# Patient Record
Sex: Female | Born: 1995 | Hispanic: Yes | Marital: Single | Smoking: Never smoker
Health system: Southern US, Community
[De-identification: ages and names within clinical notes are randomized; demographics above are authoritative.]

## PROBLEM LIST (undated history)

## (undated) DIAGNOSIS — R002 Palpitations: Secondary | ICD-10-CM

## (undated) DIAGNOSIS — R0600 Dyspnea, unspecified: Secondary | ICD-10-CM

## (undated) DIAGNOSIS — D352 Benign neoplasm of pituitary gland: Secondary | ICD-10-CM

## (undated) HISTORY — DX: Dyspnea, unspecified: R06.00

## (undated) HISTORY — DX: Palpitations: R00.2

## (undated) HISTORY — DX: Morbid (severe) obesity due to excess calories: E66.01

---

## 2016-01-06 ENCOUNTER — Emergency Department: Payer: BLUE CROSS/BLUE SHIELD

## 2016-01-06 ENCOUNTER — Emergency Department
Admission: EM | Admit: 2016-01-06 | Discharge: 2016-01-06 | Disposition: A | Discharge status: Home / Self Care | Payer: BLUE CROSS/BLUE SHIELD | Attending: Emergency Medicine | Admitting: Emergency Medicine

## 2016-01-06 DIAGNOSIS — K219 Gastro-esophageal reflux disease without esophagitis: Secondary | ICD-10-CM

## 2016-01-06 DIAGNOSIS — R0789 Other chest pain: Secondary | ICD-10-CM | POA: Diagnosis present

## 2016-01-06 DIAGNOSIS — R079 Chest pain, unspecified: Secondary | ICD-10-CM

## 2016-01-06 LAB — BASIC METABOLIC PANEL
ANION GAP: 8 (ref 5–15)
BUN: 18 mg/dL (ref 6–20)
CALCIUM: 9.4 mg/dL (ref 8.9–10.3)
CO2: 23 mmol/L (ref 22–32)
Chloride: 106 mmol/L (ref 101–111)
Creatinine, Ser: 0.53 mg/dL (ref 0.44–1.00)
GLUCOSE: 114 mg/dL — AB (ref 65–99)
Potassium: 3.7 mmol/L (ref 3.5–5.1)
Sodium: 137 mmol/L (ref 135–145)

## 2016-01-06 LAB — CBC
HCT: 41.6 % (ref 35.0–47.0)
HEMOGLOBIN: 13.9 g/dL (ref 12.0–16.0)
MCH: 29.5 pg (ref 26.0–34.0)
MCHC: 33.4 g/dL (ref 32.0–36.0)
MCV: 88.3 fL (ref 80.0–100.0)
Platelets: 333 10*3/uL (ref 150–440)
RBC: 4.71 MIL/uL (ref 3.80–5.20)
RDW: 13 % (ref 11.5–14.5)
WBC: 11.6 10*3/uL — ABNORMAL HIGH (ref 3.6–11.0)

## 2016-01-06 LAB — TROPONIN I

## 2016-01-06 MED ORDER — PANTOPRAZOLE SODIUM 20 MG PO TBEC: 20.0000 mg | DELAYED_RELEASE_TABLET | Freq: Every day | ORAL | 1 refills | Status: DC

## 2016-01-06 MED ORDER — GI COCKTAIL ~~LOC~~
30.0000 mL | Freq: Once | ORAL | Status: AC
  Administered 2016-01-06: 30 mL via ORAL
  Filled 2016-01-06: qty 30

## 2016-01-06 NOTE — ED Notes (Signed)
Patient reports chest pain and pressure that started yesterday. Last night it went away but when she woke up this morning the pain was there again. Patient states that the pressure radiates around both sides of her chest and into her back. Patient reports nausea with no vomiting or diarrhea. Patient diaphoresis or dizziness. Pressure is worse after eating.

## 2016-01-06 NOTE — ED Triage Notes (Signed)
Pt arrives to ER via POV c/o chest tightness and SOB that occurred yesterday and again today. Denies NV, diaphoresis. Pt alert and oriented X4, active, cooperative, pt in NAD. RR even and unlabored, color WNL.

## 2016-01-06 NOTE — ED Provider Notes (Signed)
[facility] Emergency Department Provider Note  Time seen: 2:20 PM  I have reviewed the triage vital signs and the nursing notes.   HISTORY  Chief Complaint Chest Pain    HPI Dana Ibarra is a 20 y.o. female with no past medical history who presents to the emergency department for chest discomfort. According to the patient for the past 2 days she has woken up with a vague pressure sensation to the middle of her chest that radiates to her back. States it improved somewhat throughout the day but then worsens upon awakening. Patient states this is happened to her several times in the past, comes and goes but this is the first time it has happened 2 days in a row. Patient denies any trouble breathing. Denies any nausea or diaphoresis. Denies any history of reflux in the past.  History reviewed. No pertinent past medical history.  There are no active problems to display for this patient.   History reviewed. No pertinent surgical history.  Prior to Admission medications   Not on File    No Known Allergies  No family history on file.  Social History Social History  Substance Use Topics  . Smoking status: Never Smoker  . Smokeless tobacco: Not on file  . Alcohol use No    Review of Systems Constitutional: Negative for fever. Cardiovascular: Central chest discomfort Respiratory: Negative for shortness of breath. Gastrointestinal: Negative for abdominal pain Musculoskeletal: Some radiation of pain to the back. Neurological: Negative for headache 10-point ROS otherwise negative.  ____________________________________________   PHYSICAL EXAM:  VITAL SIGNS: ED Triage Vitals  Enc Vitals Group     BP 01/06/16 1234 (!) 128/91     Pulse Rate 01/06/16 1234 90     Resp 01/06/16 1234 18     Temp 01/06/16 1234 98.5 F (36.9 C)     Temp Source 01/06/16 1234 Oral     SpO2 01/06/16 1234 97 %     Weight 01/06/16 1230 145 lb (65.8 kg)     Height 01/06/16  1230 5\' 2"  (1.575 m)     Head Circumference --      Peak Flow --      Pain Score 01/06/16 1231 8     Pain Loc --      Pain Edu? --      Excl. in [place]? --     Constitutional: Alert and oriented. Well appearing and in no distress. Eyes: Normal exam ENT   Head: Normocephalic and atraumatic   Mouth/Throat: Mucous membranes are moist. Cardiovascular: Normal rate, regular rhythm. No murmur Respiratory: Normal respiratory effort without tachypnea nor retractions. Breath sounds are clear  Gastrointestinal: Soft and nontender. No distention.  Musculoskeletal: Nontender with normal range of motion in all extremities.  Neurologic:  Normal speech and language. No gross focal neurologic deficits Skin:  Skin is warm, dry and intact.  Psychiatric: Mood and affect are normal.   ____________________________________________    EKG  EKG reviewed and interpreted by myself shows normal sinus rhythm at 79 bpm, narrow QRS, normal axis, normal intervals, no ST changes. Overall normal EKG.  ____________________________________________    RADIOLOGY  Chest x-ray is negative  ____________________________________________   INITIAL IMPRESSION / ASSESSMENT AND PLAN / ED COURSE  Pertinent labs & imaging results that were available during my care of the patient were reviewed by me and considered in my medical decision making (see chart for details).  The patient presents the emergency department with intermittent chest discomfort worse  over the past 2 mornings upon awakening, that improves throughout the day. Patient's symptoms are somewhat suggestive of gastric reflux. Patient's labs are within normal limits. Troponin is negative. EKG is normal. Chest x-rays negative. We will treat with a GI cocktail. Patient agreeable plan. She states very minimal chest discomfort currently.  Patient states initially after GI cocktail she feels the symptoms worsened, but now have improved. With normal labs normal  heart enzymes normal x-ray and EKG I believe the patient is safe for discharge home with PCP follow-up. We'll discharge with Protonix. Patient agreeable to plan.  ____________________________________________   FINAL CLINICAL IMPRESSION(S) / ED DIAGNOSES  Chest pain    Harvest Dark, MD 01/06/16 1515

## 2016-01-06 NOTE — Discharge Instructions (Signed)
You have been seen in the emergency department today for chest pain. Your workup has shown normal results. As we discussed please follow-up with your primary care physician in the next 1-2 days for recheck. Return to the emergency department for any further chest pain, trouble breathing, or any other symptom personally concerning to yourself. °

## 2016-07-02 ENCOUNTER — Other Ambulatory Visit: Payer: Self-pay | Admitting: Family Medicine

## 2016-07-02 DIAGNOSIS — N643 Galactorrhea not associated with childbirth: Secondary | ICD-10-CM

## 2016-07-05 ENCOUNTER — Ambulatory Visit
Admission: RE | Admit: 2016-07-05 | Discharge: 2016-07-05 | Disposition: A | Discharge status: Home / Self Care | Payer: BLUE CROSS/BLUE SHIELD | Source: Ambulatory Visit | Attending: Family Medicine | Admitting: Family Medicine

## 2016-07-05 DIAGNOSIS — N643 Galactorrhea not associated with childbirth: Secondary | ICD-10-CM

## 2016-07-05 DIAGNOSIS — O926 Galactorrhea: Secondary | ICD-10-CM | POA: Diagnosis present

## 2016-07-05 MED ORDER — GADOBENATE DIMEGLUMINE 529 MG/ML IV SOLN
10.0000 mL | Freq: Once | INTRAVENOUS | Status: AC | PRN
  Administered 2016-07-05: 6 mL via INTRAVENOUS

## 2016-12-08 ENCOUNTER — Encounter: Payer: Self-pay | Admitting: Emergency Medicine

## 2016-12-08 ENCOUNTER — Emergency Department
Admission: EM | Admit: 2016-12-08 | Discharge: 2016-12-08 | Disposition: A | Discharge status: Home / Self Care | Payer: BLUE CROSS/BLUE SHIELD | Attending: Emergency Medicine | Admitting: Emergency Medicine

## 2016-12-08 DIAGNOSIS — E876 Hypokalemia: Secondary | ICD-10-CM | POA: Diagnosis not present

## 2016-12-08 DIAGNOSIS — H81391 Other peripheral vertigo, right ear: Secondary | ICD-10-CM | POA: Diagnosis not present

## 2016-12-08 DIAGNOSIS — R42 Dizziness and giddiness: Secondary | ICD-10-CM | POA: Diagnosis present

## 2016-12-08 HISTORY — DX: Benign neoplasm of pituitary gland: D35.2

## 2016-12-08 LAB — BASIC METABOLIC PANEL
Anion gap: 7 (ref 5–15)
BUN: 13 mg/dL (ref 6–20)
CO2: 19 mmol/L — ABNORMAL LOW (ref 22–32)
CREATININE: 0.61 mg/dL (ref 0.44–1.00)
Calcium: 8 mg/dL — ABNORMAL LOW (ref 8.9–10.3)
Chloride: 113 mmol/L — ABNORMAL HIGH (ref 101–111)
GFR calc Af Amer: >60 mL/min (ref >60)
GLUCOSE: 85 mg/dL (ref 65–99)
Potassium: 3 mmol/L — ABNORMAL LOW (ref 3.5–5.1)
SODIUM: 139 mmol/L (ref 135–145)

## 2016-12-08 LAB — URINALYSIS, COMPLETE (UACMP) WITH MICROSCOPIC
BILIRUBIN URINE: NEGATIVE
GLUCOSE, UA: NEGATIVE mg/dL
KETONES UR: 5 mg/dL — AB
NITRITE: NEGATIVE
PH: 7 (ref 5.0–8.0)
Protein, ur: NEGATIVE mg/dL
SPECIFIC GRAVITY, URINE: 1.011 (ref 1.005–1.030)

## 2016-12-08 LAB — CBC
HCT: 36.3 % (ref 35.0–47.0)
Hemoglobin: 12.4 g/dL (ref 12.0–16.0)
MCH: 28.7 pg (ref 26.0–34.0)
MCHC: 34.1 g/dL (ref 32.0–36.0)
MCV: 84.1 fL (ref 80.0–100.0)
PLATELETS: 293 10*3/uL (ref 150–440)
RBC: 4.32 MIL/uL (ref 3.80–5.20)
RDW: 13.9 % (ref 11.5–14.5)
WBC: 11.4 10*3/uL — AB (ref 3.6–11.0)

## 2016-12-08 LAB — GLUCOSE, CAPILLARY: Glucose-Capillary: 87 mg/dL (ref 65–99)

## 2016-12-08 LAB — POCT PREGNANCY, URINE: PREG TEST UR: NEGATIVE

## 2016-12-08 MED ORDER — MECLIZINE HCL 25 MG PO TABS
50.0000 mg | ORAL_TABLET | Freq: Once | ORAL | Status: AC
  Administered 2016-12-08: 50 mg via ORAL
  Filled 2016-12-08: qty 2

## 2016-12-08 MED ORDER — POTASSIUM CHLORIDE CRYS ER 20 MEQ PO TBCR
40.0000 meq | EXTENDED_RELEASE_TABLET | Freq: Once | ORAL | Status: AC
  Administered 2016-12-08: 40 meq via ORAL
  Filled 2016-12-08: qty 2

## 2016-12-08 MED ORDER — MECLIZINE HCL 25 MG PO TABS: 25.0000 mg | ORAL_TABLET | Freq: Three times a day (TID) | ORAL | 1 refills | Status: DC | PRN

## 2016-12-08 MED ORDER — MECLIZINE HCL 25 MG PO TABS
ORAL_TABLET | ORAL | Status: AC
  Filled 2016-12-08: qty 1

## 2016-12-08 MED ORDER — ONDANSETRON 4 MG PO TBDP: 4.0000 mg | ORAL_TABLET | Freq: Three times a day (TID) | ORAL | 0 refills | Status: DC | PRN

## 2016-12-08 NOTE — ED Triage Notes (Signed)
Pt arrives ambulatory to triage with family for c/o dizziness. Pt has hx of pituitary tumor that they are watching. Pt states that she started feeling dizzy around 1700 this evening but denies LOC or trauma of any kind. Pt is in NAD at this time in triage.

## 2016-12-08 NOTE — ED Provider Notes (Signed)
[facility] Emergency Department Provider Note  ____________________________________________  Time seen: Approximately 10:16 PM  I have reviewed the triage vital signs and the nursing notes.   HISTORY  Chief Complaint Dizziness    HPI Dana Ibarra is a 21 y.o. female who complains of dizziness described as room spinning that is been intermittent since 5 PM today. Worse with turning her head to the right or lying down quickly. No vision changes, headache, paresthesias or motor weakness. No trauma. Denies fever chills no neck pain or stiffness. No other acute complaints, no dysuria frequency urgency.  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 She's been on medication for this, and recent testing shows her prolactin level markedly decreased.     Past Medical History:  Diagnosis Date  . Benign tumor of pituitary gland ([place])      There are no active problems to display for this patient.    History reviewed. No pertinent surgical history.   Prior to Admission medications   Medication Sig Start Date End Date Taking? Authorizing Provider  cabergoline (DOSTINEX) 0.5 MG tablet Take 0.5 mg by mouth once a week. 12/05/16  Yes [provider]  meclizine (ANTIVERT) 25 MG tablet Take 1 tablet (25 mg total) by mouth 3 (three) times daily as needed for dizziness or nausea. 12/08/16   Carrie Mew, MD  ondansetron (ZOFRAN ODT) 4 MG disintegrating tablet Take 1 tablet (4 mg total) by mouth every 8 (eight) hours as needed for nausea or vomiting. 12/08/16   Carrie Mew, MD  pantoprazole (PROTONIX) 20 MG tablet Take 1 tablet (20 mg total) by mouth daily. Patient not taking: Reported on 12/08/2016 01/06/16 01/05/17  Harvest Dark, MD     Allergies Patient has no known allergies.   No family history on file.  Social History Social History  Substance Use Topics  . Smoking status: Never Smoker  .  Smokeless tobacco: Never Used  . Alcohol use No    Review of Systems  Constitutional:   No fever or chills.  ENT:   No sore throat. No rhinorrhea. Cardiovascular:   No chest pain or syncope. Respiratory:   No dyspnea or cough. Gastrointestinal:   Negative for abdominal pain, vomiting and diarrhea.  Musculoskeletal:   Negative for focal pain or swelling All other systems reviewed and are negative except as documented above in ROS and HPI.  ____________________________________________   PHYSICAL EXAM:  VITAL SIGNS: ED Triage Vitals  Enc Vitals Group     BP 12/08/16 1907 123/81     Pulse Rate 12/08/16 1907 85     Resp 12/08/16 1907 18     Temp 12/08/16 1907 98 F (36.7 C)     Temp Source 12/08/16 1907 Oral     SpO2 12/08/16 1907 100 %     Weight 12/08/16 1910 160 lb (72.6 kg)     Height 12/08/16 1910 5\' 2"  (1.575 m)     Head Circumference --      Peak Flow --      Pain Score 12/08/16 1909 0     Pain Loc --      Pain Edu? --      Excl. in [place]? --     Vital signs reviewed, nursing assessments reviewed.   Constitutional:   Alert and oriented. Well appearing and in no distress. Eyes:   No scleral icterus.  EOMI. No nystagmus. No conjunctival pallor. PERRL. ENT   Head:  Normocephalic and atraumatic.   Nose:   No congestion/rhinnorhea.    Mouth/Throat:   MMM, no pharyngeal erythema. No peritonsillar mass.    Neck:   No meningismus. Full ROM Hematological/Lymphatic/Immunilogical:   No cervical lymphadenopathy. Cardiovascular:   RRR. Symmetric bilateral radial and DP pulses.  No murmurs.  Respiratory:   Normal respiratory effort without tachypnea/retractions. Breath sounds are clear and equal bilaterally. No wheezes/rales/rhonchi. Gastrointestinal:   Soft and nontender. Non distended. There is no CVA tenderness.  No rebound, rigidity, or guarding. Genitourinary:   deferred Musculoskeletal:   Normal range of motion in all extremities. No joint effusions.  No  lower extremity tenderness.  No edema. Neurologic:   Normal speech and language.  Motor grossly intact. no pronator drift. Normal gait. No gross focal neurologic deficits are appreciated.  Skin:    Skin is warm, dry and intact. No rash noted.  No petechiae, purpura, or bullae.  ____________________________________________    LABS (pertinent positives/negatives) (all labs ordered are listed, but only abnormal results are displayed) Labs Reviewed  BASIC METABOLIC PANEL - Abnormal; Notable for the following:       Result Value   Potassium 3.0 (*)    Chloride 113 (*)    CO2 19 (*)    Calcium 8.0 (*)    All other components within normal limits  CBC - Abnormal; Notable for the following:    WBC 11.4 (*)    All other components within normal limits  URINALYSIS, COMPLETE (UACMP) WITH MICROSCOPIC - Abnormal; Notable for the following:    Color, Urine STRAW (*)    APPearance CLEAR (*)    Hgb urine dipstick MODERATE (*)    Ketones, ur 5 (*)    Leukocytes, UA SMALL (*)    Bacteria, UA RARE (*)    Squamous Epithelial / LPF 0-5 (*)    All other components within normal limits  GLUCOSE, CAPILLARY  CBG MONITORING, ED  POC URINE PREG, ED  POCT PREGNANCY, URINE   ____________________________________________   EKG  interpreted by me Normal sinus rhythm rate of 83, rightward axis, normal ST segments and T waves. Normal QRS.  ____________________________________________    RADIOLOGY  No results found.  ____________________________________________   PROCEDURES Procedures  ____________________________________________   INITIAL IMPRESSION / ASSESSMENT AND PLAN / ED COURSE  Pertinent labs & imaging results that were available during my care of the patient were reviewed by me and considered in my medical decision making (see chart for details).  patient well appearing no acute distress presents with symptoms consistent with peripheral vertigo. Neuro exam is benign, vital  signs normal, labs unremarkable. I don't think repeat neuro imaging is warranted at this time, low suspicion for intracranial mass effect. Dizziness and vertigo should not be a consequence of a pituitary mass. It is however a known side effect of the medication she is taken cabergoline. Recent follow-up testing of her prolactin level shows good response to therapy.continue meclizine, Zofran as needed, follow up with primary care.      ____________________________________________   FINAL CLINICAL IMPRESSION(S) / ED DIAGNOSES  Final diagnoses:  Peripheral vertigo involving right ear  Hypokalemia      New Prescriptions   MECLIZINE (ANTIVERT) 25 MG TABLET    Take 1 tablet (25 mg total) by mouth 3 (three) times daily as needed for dizziness or nausea.   ONDANSETRON (ZOFRAN ODT) 4 MG DISINTEGRATING TABLET    Take 1 tablet (4 mg total) by mouth every 8 (eight) hours as needed for  nausea or vomiting.     Portions of this note were generated with dragon dictation software. Dictation errors may occur despite best attempts at proofreading.    Carrie Mew, MD 12/08/16 2221

## 2016-12-08 NOTE — ED Notes (Signed)
Patient urged to void, given ginger ale

## 2020-07-03 ENCOUNTER — Encounter: Payer: Self-pay | Admitting: Radiology

## 2020-07-03 ENCOUNTER — Emergency Department
Admission: EM | Admit: 2020-07-03 | Discharge: 2020-07-03 | Disposition: A | Discharge status: Home / Self Care | Payer: BC Managed Care – PPO | Attending: Emergency Medicine | Admitting: Emergency Medicine

## 2020-07-03 ENCOUNTER — Emergency Department: Payer: BC Managed Care – PPO

## 2020-07-03 ENCOUNTER — Other Ambulatory Visit: Payer: Self-pay

## 2020-07-03 DIAGNOSIS — R1110 Vomiting, unspecified: Secondary | ICD-10-CM | POA: Diagnosis present

## 2020-07-03 DIAGNOSIS — K529 Noninfective gastroenteritis and colitis, unspecified: Secondary | ICD-10-CM | POA: Diagnosis not present

## 2020-07-03 DIAGNOSIS — Z86018 Personal history of other benign neoplasm: Secondary | ICD-10-CM | POA: Insufficient documentation

## 2020-07-03 DIAGNOSIS — Z20822 Contact with and (suspected) exposure to covid-19: Secondary | ICD-10-CM | POA: Diagnosis not present

## 2020-07-03 DIAGNOSIS — R072 Precordial pain: Secondary | ICD-10-CM | POA: Insufficient documentation

## 2020-07-03 DIAGNOSIS — R079 Chest pain, unspecified: Secondary | ICD-10-CM

## 2020-07-03 LAB — URINALYSIS, COMPLETE (UACMP) WITH MICROSCOPIC
Bilirubin Urine: NEGATIVE
Glucose, UA: NEGATIVE mg/dL
Ketones, ur: 20 mg/dL — AB
Leukocytes,Ua: NEGATIVE
Nitrite: NEGATIVE
Protein, ur: NEGATIVE mg/dL
RBC / HPF: >50 RBC/hpf — ABNORMAL HIGH (ref 0–5)
Specific Gravity, Urine: 1.018 (ref 1.005–1.030)
pH: 6 (ref 5.0–8.0)

## 2020-07-03 LAB — RESP PANEL BY RT-PCR (FLU A&B, COVID) ARPGX2
Influenza A by PCR: NEGATIVE
Influenza B by PCR: NEGATIVE
SARS Coronavirus 2 by RT PCR: NEGATIVE

## 2020-07-03 LAB — BASIC METABOLIC PANEL
Anion gap: 8 (ref 5–15)
BUN: 15 mg/dL (ref 6–20)
CO2: 22 mmol/L (ref 22–32)
Calcium: 8.8 mg/dL — ABNORMAL LOW (ref 8.9–10.3)
Chloride: 109 mmol/L (ref 98–111)
Creatinine, Ser: 0.62 mg/dL (ref 0.44–1.00)
GFR, Estimated: >60 mL/min (ref >60)
Glucose, Bld: 120 mg/dL — ABNORMAL HIGH (ref 70–99)
Potassium: 3.8 mmol/L (ref 3.5–5.1)
Sodium: 139 mmol/L (ref 135–145)

## 2020-07-03 LAB — TROPONIN I (HIGH SENSITIVITY)
Troponin I (High Sensitivity): <2 ng/L (ref <18)
Troponin I (High Sensitivity): <2 ng/L (ref <18)

## 2020-07-03 LAB — D-DIMER, QUANTITATIVE: D-Dimer, Quant: 0.35 ug/mL-FEU (ref 0.00–0.50)

## 2020-07-03 LAB — CBC
HCT: 41.2 % (ref 36.0–46.0)
Hemoglobin: 13.3 g/dL (ref 12.0–15.0)
MCH: 27.5 pg (ref 26.0–34.0)
MCHC: 32.3 g/dL (ref 30.0–36.0)
MCV: 85.3 fL (ref 80.0–100.0)
Platelets: 377 10*3/uL (ref 150–400)
RBC: 4.83 MIL/uL (ref 3.87–5.11)
RDW: 13.2 % (ref 11.5–15.5)
WBC: 18.4 10*3/uL — ABNORMAL HIGH (ref 4.0–10.5)
nRBC: 0 % (ref 0.0–0.2)

## 2020-07-03 LAB — POC URINE PREG, ED: Preg Test, Ur: NEGATIVE

## 2020-07-03 MED ORDER — IOHEXOL 350 MG/ML SOLN
75.0000 mL | Freq: Once | INTRAVENOUS | Status: AC | PRN
  Administered 2020-07-03: 75 mL via INTRAVENOUS

## 2020-07-03 MED ORDER — ONDANSETRON 4 MG PO TBDP: 4.0000 mg | ORAL_TABLET | Freq: Once | ORAL | Status: AC

## 2020-07-03 MED ORDER — ONDANSETRON 4 MG PO TBDP: 4.0000 mg | ORAL_TABLET | Freq: Three times a day (TID) | ORAL | 0 refills | Status: DC | PRN

## 2020-07-03 MED ORDER — SODIUM CHLORIDE 0.9 % IV BOLUS
1000.0000 mL | Freq: Once | INTRAVENOUS | Status: AC
  Administered 2020-07-03: 1000 mL via INTRAVENOUS

## 2020-07-03 MED ORDER — ONDANSETRON 4 MG PO TBDP
ORAL_TABLET | ORAL | Status: AC
  Administered 2020-07-03: 4 mg via ORAL
  Filled 2020-07-03: qty 1

## 2020-07-03 MED ORDER — SODIUM CHLORIDE 0.9 % IV BOLUS
1000.0000 mL | Freq: Once | INTRAVENOUS | Status: AC
Start: 2020-07-03 — End: 2020-07-03
  Administered 2020-07-03: 1000 mL via INTRAVENOUS

## 2020-07-03 MED ORDER — LIDOCAINE VISCOUS HCL 2 % MT SOLN
15.0000 mL | Freq: Once | OROMUCOSAL | Status: AC
  Administered 2020-07-03: 15 mL via ORAL
  Filled 2020-07-03: qty 15

## 2020-07-03 MED ORDER — ALUM & MAG HYDROXIDE-SIMETH 200-200-20 MG/5ML PO SUSP
30.0000 mL | Freq: Once | ORAL | Status: AC
  Administered 2020-07-03: 30 mL via ORAL
  Filled 2020-07-03: qty 30

## 2020-07-03 MED ORDER — ONDANSETRON HCL 4 MG/2ML IJ SOLN
4.0000 mg | Freq: Once | INTRAMUSCULAR | Status: DC
  Filled 2020-07-03: qty 2

## 2020-07-03 NOTE — Discharge Instructions (Addendum)
Follow-up with your regular doctor if not improving to 3 days.  Return emergency department worsening.  Take medications as prescribed. 

## 2020-07-03 NOTE — ED Triage Notes (Signed)
Pt via POV from home. Pt c/o centralized CP that radiates to her back that started this AM. Pt describes it as aching and pressure. Also c/o SOB that accompanied the CP. Pt states she has been having diarrhea and vomiting. Denies cough and fevers. Pt A&Ox4 and NAD.

## 2020-07-03 NOTE — ED Provider Notes (Signed)
[facility] Emergency Department Provider Note  ____________________________________________   Event Date/Time   First MD Initiated Contact with Patient 07/03/20 1309     (approximate)  I have reviewed the triage vital signs and the nursing notes.   HISTORY  Chief Complaint Chest Pain    HPI Dana Ibarra is a 25 y.o. female presents emergency department complaining of vomiting and diarrhea that started early this morning/last night.  Patient states she has had 4 episodes of vomiting and 5 episodes of diarrhea.  States she had some midsternal chest pain that caused her to have difficulty breathing.  She denies any fever or chills.  No UTI symptoms.  Patient has a IUD for birth control.  She is a non-smoker.    Past Medical History:  Diagnosis Date  . Benign tumor of pituitary gland ([place])     There are no problems to display for this patient.   No past surgical history on file.  Prior to Admission medications   Medication Sig Start Date End Date Taking? Authorizing Provider  ondansetron (ZOFRAN-ODT) 4 MG disintegrating tablet Take 1 tablet (4 mg total) by mouth every 8 (eight) hours as needed. 07/03/20  Yes Gwenith Tschida, Linden Dolin, PA-C  cabergoline (DOSTINEX) 0.5 MG tablet Take 0.5 mg by mouth once a week. 12/05/16   [provider]  meclizine (ANTIVERT) 25 MG tablet Take 1 tablet (25 mg total) by mouth 3 (three) times daily as needed for dizziness or nausea. 12/08/16   Carrie Mew, MD  pantoprazole (PROTONIX) 20 MG tablet Take 1 tablet (20 mg total) by mouth daily. Patient not taking: Reported on 12/08/2016 01/06/16 01/05/17  Harvest Dark, MD    Allergies Patient has no known allergies.  No family history on file.  Social History Social History   Tobacco Use  . Smoking status: Never Smoker  . Smokeless tobacco: Never Used  Substance Use Topics  . Alcohol use: No  . Drug use: No    Review of Systems  Constitutional: No  fever/chills Eyes: No visual changes. ENT: No sore throat. Respiratory: Denies cough Cardiovascular: Positive chest pain Gastrointestinal: Denies abdominal pain, positive vomiting diarrhea Genitourinary: Negative for dysuria. Musculoskeletal: Negative for back pain. Skin: Negative for rash. Psychiatric: no mood changes,     ____________________________________________   PHYSICAL EXAM:  VITAL SIGNS: ED Triage Vitals  Enc Vitals Group     BP 07/03/20 1136 115/80     Pulse Rate 07/03/20 1136 (!) 113     Resp 07/03/20 1136 20     Temp 07/03/20 1136 98.5 F (36.9 C)     Temp Source 07/03/20 1136 Oral     SpO2 07/03/20 1136 98 %     Weight 07/03/20 1137 160 lb (72.6 kg)     Height 07/03/20 1137 5\' 2"  (1.575 m)     Head Circumference --      Peak Flow --      Pain Score 07/03/20 1137 8     Pain Loc --      Pain Edu? --      Excl. in [place]? --     Constitutional: Alert and oriented. Well appearing and in no acute distress. Eyes: Conjunctivae are normal.  Head: Atraumatic. Nose: No congestion/rhinnorhea. Mouth/Throat: Mucous membranes are moist.   Neck:  supple no lymphadenopathy noted Cardiovascular: Normal rate, regular rhythm. Heart sounds are normal Respiratory: Normal respiratory effort.  No retractions, lungs c t a  Abd: soft nontender bs normal all 4 quad  GU: deferred Musculoskeletal: FROM all extremities, warm and well perfused Neurologic:  Normal speech and language.  Skin:  Skin is warm, dry and intact. No rash noted. Psychiatric: Mood and affect are normal. Speech and behavior are normal.  ____________________________________________   LABS (all labs ordered are listed, but only abnormal results are displayed)  Labs Reviewed  BASIC METABOLIC PANEL - Abnormal; Notable for the following components:      Result Value   Glucose, Bld 120 (*)    Calcium 8.8 (*)    All other components within normal limits  CBC - Abnormal; Notable for the following components:    WBC 18.4 (*)    All other components within normal limits  URINALYSIS, COMPLETE (UACMP) WITH MICROSCOPIC - Abnormal; Notable for the following components:   Color, Urine YELLOW (*)    APPearance HAZY (*)    Hgb urine dipstick LARGE (*)    Ketones, ur 20 (*)    RBC / HPF >50 (*)    Bacteria, UA RARE (*)    All other components within normal limits  RESP PANEL BY RT-PCR (FLU A&B, COVID) ARPGX2  D-DIMER, QUANTITATIVE  POC URINE PREG, ED  TROPONIN I (HIGH SENSITIVITY)  TROPONIN I (HIGH SENSITIVITY)   ____________________________________________   ____________________________________________  RADIOLOGY  Chest x-ray CTA for PE  ____________________________________________   PROCEDURES  Procedure(s) performed: EKG shows tachycardia, see physician read  Procedures    ____________________________________________   INITIAL IMPRESSION / ASSESSMENT AND PLAN / ED COURSE  Pertinent labs & imaging results that were available during my care of the patient were reviewed by me and considered in my medical decision making (see chart for details).   Patient is 25 year old female presents with vomiting/diarrhea, chest pain and some shortness of breath.  See HPI.  Physical exam is actually unremarkable other than the tachycardia.   DDx: Gastroenteritis, influenza, Covid, CAP, PE  CBC has elevated WBC of 50.0, basic metabolic panel with glucose of 120, troponin is normal  Patient still tachycardic after 1 L normal saline.  Still complaining of chest pain that makes her feel short of breath.  CT for PE ordered  CTA for PE reviewed by me confirmed by radiology to be normal  Did explain everything to the patient.  She was given a prescription for Zofran ODT.  Follow-up with her regular doctor if not improving to 3 days.  Return if worsening.  She is discharged stable condition.   Dana Ibarra was evaluated in Emergency Department on 07/03/2020 for the symptoms described in the  history of present illness. She was evaluated in the context of the global COVID-19 pandemic, which necessitated consideration that the patient might be at risk for infection with the SARS-CoV-2 virus that causes COVID-19. Institutional protocols and algorithms that pertain to the evaluation of patients at risk for COVID-19 are in a state of rapid change based on information released by regulatory bodies including the CDC and federal and state organizations. These policies and algorithms were followed during the patient's care in the ED.    As part of my medical decision making, I reviewed the following data within the [facility] notes reviewed and incorporated, Labs reviewed , EKG interpreted tachycardia, Old chart reviewed, Radiograph reviewed , Notes from prior ED visits and [place] Controlled Substance Database  ____________________________________________   FINAL CLINICAL IMPRESSION(S) / ED DIAGNOSES  Final diagnoses:  Gastroenteritis  Nonspecific chest pain      NEW MEDICATIONS STARTED DURING THIS VISIT:  New Prescriptions   ONDANSETRON (ZOFRAN-ODT) 4 MG DISINTEGRATING TABLET    Take 1 tablet (4 mg total) by mouth every 8 (eight) hours as needed.     Note:  This document was prepared using Dragon voice recognition software and may include unintentional dictation errors.    Versie Starks, PA-C 07/03/20 Mirian Capuchin    Vladimir Crofts, MD 07/04/20 1120

## 2020-07-03 NOTE — ED Notes (Signed)
Pt actively vomiting during triage after straight stick. See orders.

## 2020-07-03 NOTE — ED Notes (Signed)
Signature pad not working, pt provided with AVS/discharge instructions, answered all questions, discharged to home with husband

## 2021-07-27 ENCOUNTER — Ambulatory Visit (INDEPENDENT_AMBULATORY_CARE_PROVIDER_SITE_OTHER): Payer: BC Managed Care – PPO

## 2021-07-27 ENCOUNTER — Encounter: Payer: Self-pay | Admitting: Cardiology

## 2021-07-27 ENCOUNTER — Ambulatory Visit: Payer: BC Managed Care – PPO | Admitting: Cardiology

## 2021-07-27 VITALS — BP 108/60 | HR 80 | Ht 63.0 in | Wt 172.0 lb

## 2021-07-27 DIAGNOSIS — R002 Palpitations: Secondary | ICD-10-CM | POA: Diagnosis not present

## 2021-07-27 DIAGNOSIS — R0602 Shortness of breath: Secondary | ICD-10-CM | POA: Diagnosis not present

## 2021-07-27 NOTE — Patient Instructions (Signed)
Medication Instructions:  ?Your physician recommends that you continue on your current medications as directed. Please refer to the Current Medication list given to you today. ? ?*If you need a refill on your cardiac medications before your next appointment, please call your pharmacy* ? ? ?Lab Work: ?None ordered ?If you have labs (blood work) drawn today and your tests are completely normal, you will receive your results only by: ?MyChart Message (if you have MyChart) OR ?A paper copy in the mail ?If you have any lab test that is abnormal or we need to change your treatment, we will call you to review the results. ? ? ?Testing/Procedures: ? ?Your physician has requested that you have an echocardiogram. Echocardiography is a painless test that uses sound waves to create images of your heart. It provides your doctor with information about the size and shape of your heart and how well your heart?s chambers and valves are working. This procedure takes approximately one hour. There are no restrictions for this procedure. ? ?2.   Your physician has recommended that you wear a Zio XT monitor for 2 weeks. This will be mailed to your home address in 4-5 business days.  ? ?This monitor is a medical device that records the heart?s electrical activity. Doctors most often use these monitors to diagnose arrhythmias. Arrhythmias are problems with the speed or rhythm of the heartbeat. The monitor is a small device applied to your chest. You can wear one while you do your normal daily activities. While wearing this monitor if you have any symptoms to push the button and record what you felt. Once you have worn this monitor for the period of time provider prescribed (Usually 14 days), you will return the monitor device in the postage paid box. Once it is returned they will download the data collected and provide [place] with a report which the provider will then review and we will call you with those results. Important tips: ? ?Avoid  showering during the first 24 hours of wearing the monitor. ?Avoid excessive sweating to help maximize wear time. ?Do not submerge the device, no hot tubs, and no swimming pools. ?Keep any lotions or oils away from the patch. ?After 24 hours you may shower with the patch on. Take brief showers with your back facing the shower head.  ?Do not remove patch once it has been placed because that will interrupt data and decrease adhesive wear time. ?Push the button when you have any symptoms and write down what you were feeling. ?Once you have completed wearing your monitor, remove and place into box which has postage paid and place in your outgoing mailbox.  ?If for some reason you have misplaced your box then call our office and we can provide another box and/or mail it off for you. ? ?  ? ? ? ?Follow-Up: ?At [facility], you and your health needs are our priority.  As part of our continuing mission to provide you with exceptional heart care, we have created designated Provider Care Teams.  These Care Teams include your primary Cardiologist (physician) and Advanced Practice Providers (APPs -  Physician Assistants and Nurse Practitioners) who all work together to provide you with the care you need, when you need it. ? ?We recommend signing up for the patient portal called "MyChart".  Sign up information is provided on this After Visit Summary.  MyChart is used to connect with patients for Virtual Visits (Telemedicine).  Patients are able to view lab/test results, encounter notes,  upcoming appointments, etc.  Non-urgent messages can be sent to your provider as well.   ?To learn more about what you can do with MyChart, go to [email or web address].   ? ?Your next appointment:   ?6 week(s) ? ?The format for your next appointment:   ?In Person ? ?Provider:   ?You may see Kate Sable, MD or one of the following Advanced Practice Providers on your designated Care Team:   ?Murray Hodgkins, NP ?Christell Faith,  PA-C ?Cadence Kathlen Mody, PA-C  ? ? ?Other Instructions ? ? ?Important Information About Sugar ? ? ? ? ? ? ?

## 2021-07-27 NOTE — Progress Notes (Signed)
?Cardiology Office Note:   ? ?Date:  07/27/2021  ? ?ID:  Dana Ibarra, DOB [birth year], MRN [id number] ? ?PCP:  Gennette Pac, FNP ?  ?[facility] Providers ?Cardiologist:  None    ? ?Referring MD: Gennette Pac, FNP  ? ?Chief Complaint  ?Patient presents with  ? New Patient (Initial Visit)  ?  Referred by PCP for  for Palpitations, Pituitary Microadenoma, Adenoid Hypertrophy. Meds reviewed verbally with patient.  ?  ? ?Dana Ibarra is a 26 y.o. female who is being seen today for the evaluation of palpitations at the request of Gennette Pac, [place].  ? ?History of Present Illness:   ? ?Dana Ibarra is a 26 y.o. female with a hx of pituitary adenoma who presents due to palpitations.  She states having worsening palpitations ongoing over the past year.  Symptoms usually last about 30 minutes, associated with shortness of breath.  Denies syncope.  Denies any history of heart disease, takes Victoza to help with weight loss. ? ?Past Medical History:  ?Diagnosis Date  ? 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 ? ?History reviewed. No pertinent surgical history. ? ?Current Medications: ?Current Meds  ?Medication Sig  ? VICTOZA 18 MG/3ML SOPN Inject into the skin.  ?  ? ?Allergies:   Patient has no known allergies.  ? ?Social History  ? ?Socioeconomic History  ? Marital status: Single  ?  Spouse name: Not on file  ? Number of children: Not on file  ? Years of education: Not on file  ? Highest education level: Not on file  ?Occupational History  ? Not on file  ?Tobacco Use  ? Smoking status: Never  ? Smokeless tobacco: Never  ?Substance and Sexual Activity  ? Alcohol use: No  ? Drug use: No  ? Sexual activity: Not on file  ?Other Topics Concern  ? Not on file  ?Social History Narrative  ? Not on file  ? ?Social Determinants of Health  ? ?Financial Resource Strain: Not on file  ?Food Insecurity: Not on file  ?Transportation Needs: Not on file  ?Physical Activity: Not on file  ?Stress: Not on file  ?Social Connections:  Not on file  ?  ? ?Family History: ?The patient's family history is not on file. ? ?ROS:   ?Please see the history of present illness.    ? All other systems reviewed and are negative. ? ?EKGs/Labs/Other Studies Reviewed:   ? ?The following studies were reviewed today: ? ? ?EKG:  EKG is  ordered today.  The ekg ordered today demonstrates normal sinus rhythm, normal ECG ? ?Recent Labs: ?No results found for requested labs within last 8760 hours.  ?Recent Lipid Panel ?No results found for: CHOL, TRIG, HDL, CHOLHDL, VLDL, LDLCALC, LDLDIRECT ? ? ?Risk Assessment/Calculations:   ? ? ?    ? ?Physical Exam:   ? ?VS:  BP 108/60 (BP Location: Left Arm, Patient Position: Sitting, Cuff Size: Normal)   Pulse 80   Ht '5\' 3"'$  (1.6 m)   Wt 172 lb (78 kg)   BMI 30.47 kg/m?    ? ?Wt Readings from Last 3 Encounters:  ?07/27/21 172 lb (78 kg)  ?07/03/20 160 lb (72.6 kg)  ?12/08/16 160 lb (72.6 kg)  ?  ? ?GEN:  Well nourished, well developed in no acute distress ?HEENT: Normal ?NECK: No JVD; No carotid bruits ?LYMPHATICS: No lymphadenopathy ?CARDIAC: RRR, no murmurs, rubs, gallops ?RESPIRATORY:  Clear to auscultation without rales, wheezing or rhonchi  ?ABDOMEN: Soft, non-tender,  non-distended ?MUSCULOSKELETAL:  No edema; No deformity  ?SKIN: Warm and dry ?NEUROLOGIC:  Alert and oriented x 3 ?PSYCHIATRIC:  Normal affect  ? ?ASSESSMENT:   ? ?1. Palpitations   ?2. Shortness of breath   ? ?PLAN:   ? ?In order of problems listed above: ? ?Palpitations, placed 2-week cardiac monitor to evaluate any significant arrhythmias. ?Shortness of breath, obtain echo to evaluate any structural abnormalities. ? ?Follow-up after echo and cardiac monitor. ? ?   ? ? ?Medication Adjustments/Labs and Tests Ordered: ?Current medicines are reviewed at length with the patient today.  Concerns regarding medicines are outlined above.  ?Orders Placed This Encounter  ?Procedures  ? LONG TERM MONITOR (3-14 DAYS)  ? EKG 12-Lead  ? ECHOCARDIOGRAM COMPLETE  ? ?No  orders of the defined types were placed in this encounter. ? ? ?Patient Instructions  ?Medication Instructions:  ?Your physician recommends that you continue on your current medications as directed. Please refer to the Current Medication list given to you today. ? ?*If you need a refill on your cardiac medications before your next appointment, please call your pharmacy* ? ? ?Lab Work: ?None ordered ?If you have labs (blood work) drawn today and your tests are completely normal, you will receive your results only by: ?MyChart Message (if you have MyChart) OR ?A paper copy in the mail ?If you have any lab test that is abnormal or we need to change your treatment, we will call you to review the results. ? ? ?Testing/Procedures: ? ?Your physician has requested that you have an echocardiogram. Echocardiography is a painless test that uses sound waves to create images of your heart. It provides your doctor with information about the size and shape of your heart and how well your heart?s chambers and valves are working. This procedure takes approximately one hour. There are no restrictions for this procedure. ? ?2.   Your physician has recommended that you wear a Zio XT monitor for 2 weeks. This will be mailed to your home address in 4-5 business days.  ? ?This monitor is a medical device that records the heart?s electrical activity. Doctors most often use these monitors to diagnose arrhythmias. Arrhythmias are problems with the speed or rhythm of the heartbeat. The monitor is a small device applied to your chest. You can wear one while you do your normal daily activities. While wearing this monitor if you have any symptoms to push the button and record what you felt. Once you have worn this monitor for the period of time provider prescribed (Usually 14 days), you will return the monitor device in the postage paid box. Once it is returned they will download the data collected and provide [place] with a report which the provider  will then review and we will call you with those results. Important tips: ? ?Avoid showering during the first 24 hours of wearing the monitor. ?Avoid excessive sweating to help maximize wear time. ?Do not submerge the device, no hot tubs, and no swimming pools. ?Keep any lotions or oils away from the patch. ?After 24 hours you may shower with the patch on. Take brief showers with your back facing the shower head.  ?Do not remove patch once it has been placed because that will interrupt data and decrease adhesive wear time. ?Push the button when you have any symptoms and write down what you were feeling. ?Once you have completed wearing your monitor, remove and place into box which has postage paid and place in your outgoing  mailbox.  ?If for some reason you have misplaced your box then call our office and we can provide another box and/or mail it off for you. ? ?  ? ? ? ?Follow-Up: ?At [facility], you and your health needs are our priority.  As part of our continuing mission to provide you with exceptional heart care, we have created designated Provider Care Teams.  These Care Teams include your primary Cardiologist (physician) and Advanced Practice Providers (APPs -  Physician Assistants and Nurse Practitioners) who all work together to provide you with the care you need, when you need it. ? ?We recommend signing up for the patient portal called "MyChart".  Sign up information is provided on this After Visit Summary.  MyChart is used to connect with patients for Virtual Visits (Telemedicine).  Patients are able to view lab/test results, encounter notes, upcoming appointments, etc.  Non-urgent messages can be sent to your provider as well.   ?To learn more about what you can do with MyChart, go to [email or web address].   ? ?Your next appointment:   ?6 week(s) ? ?The format for your next appointment:   ?In Person ? ?Provider:   ?You may see Kate Sable, MD or one of the following Advanced Practice  Providers on your designated Care Team:   ?Murray Hodgkins, NP ?Christell Faith, PA-C ?Cadence Kathlen Mody, PA-C  ? ? ?Other Instructions ? ? ?Important Information About Sugar ? ? ? ? ? ?  ? ?Signed, ?Kate Sable, MD

## 2021-07-30 DIAGNOSIS — R002 Palpitations: Secondary | ICD-10-CM

## 2021-08-23 ENCOUNTER — Other Ambulatory Visit: Payer: BC Managed Care – PPO

## 2021-09-07 ENCOUNTER — Ambulatory Visit: Payer: BC Managed Care – PPO | Admitting: Cardiology

## 2021-09-14 ENCOUNTER — Ambulatory Visit (INDEPENDENT_AMBULATORY_CARE_PROVIDER_SITE_OTHER): Payer: BC Managed Care – PPO

## 2021-09-14 DIAGNOSIS — R0602 Shortness of breath: Secondary | ICD-10-CM

## 2021-09-14 LAB — ECHOCARDIOGRAM COMPLETE
AR max vel: 1.95 cm2
AV Area VTI: 2.14 cm2
AV Area mean vel: 2.01 cm2
AV Mean grad: 3 mmHg
AV Peak grad: 4.8 mmHg
Ao pk vel: 1.1 m/s
Area-P 1/2: 5.02 cm2
S' Lateral: 2.2 cm

## 2021-10-03 ENCOUNTER — Encounter: Payer: Self-pay | Admitting: Nurse Practitioner

## 2021-10-03 ENCOUNTER — Ambulatory Visit: Payer: BC Managed Care – PPO | Admitting: Nurse Practitioner

## 2021-10-03 VITALS — BP 108/62 | HR 86 | Ht 63.0 in | Wt 173.6 lb

## 2021-10-03 DIAGNOSIS — R002 Palpitations: Secondary | ICD-10-CM

## 2021-10-03 NOTE — Progress Notes (Signed)
Office Visit    Patient Name: Dana Ibarra Date of Encounter: 10/03/2021  Primary Care Provider:  Toy Cookey, FNP Primary Cardiologist:  Debbe Odea, MD  Chief Complaint    26 year old female with a history of obesity, benign pituitary tumor, and palpitations, who presents for follow-up after recent testing for palpitations.  Past Medical History    Past Medical History:  Diagnosis Date   Benign tumor of pituitary gland ([facility])    Dyspnea    a. 09/2021 Echo: EF 60-65%, no rwma, nl RV fxn, RVSP 21.11mmHg. Mild MR.   Morbid obesity ([facility])    Palpitations    a. 07/2021 Zio: Predominantly RSR @ 89 (51-181). Rare PACs/PVCs. Triggered events assoc w/ RSR/Sinus Tach.   History reviewed. No pertinent surgical history.  Allergies  No Known Allergies  History of Present Illness    26 year old female with the above past medical history including pituitary adenoma, obesity, and palpitations.  She was evaluated in clinic on April 20 due to a 1 year history of progressive palpitations which were described as lasting about 30 minutes and being associated with shortness of breath.  She underwent Zio monitoring which showed predominantly sinus rhythm with rare PACs and PVCs.  Triggered events were associated with sinus rhythm and sinus tachycardia.  Echocardiogram earlier this month showed an EF of 60 to 65% with normal RV function, and mild mitral regurgitation.  Since her last visit, she notes that she has continued to have intermittent palpitations, that can occur randomly, last an undetermined amount of time, and resolve spontaneously.  She does not necessarily think that there is a relationship between anxiety and her symptoms.  Other than noting elevations in heart rates, she is asymptomatic.  She denies chest pain, dyspnea, PND, orthopnea, dizziness, syncope, edema, early satiety.    Home Medications    Current Outpatient Medications  Medication Sig Dispense Refill    cabergoline (DOSTINEX) 0.5 MG tablet Take 0.5 mg by mouth once a week.     levonorgestrel (MIRENA) 20 MCG/DAY IUD by Intrauterine route.     VICTOZA 18 MG/3ML SOPN Inject into the skin.     No current facility-administered medications for this visit.     Review of Systems    Occasional palpitations outlined above.  She denies chest pain, dyspnea, pnd, orthopnea, n, v, dizziness, syncope, edema, weight gain, or early satiety.  All other systems reviewed and are otherwise negative except as noted above.    Physical Exam    VS:  BP 108/62 (BP Location: Left Arm, Patient Position: Sitting, Cuff Size: Normal)   Pulse 86   Ht 5\' 3"  (1.6 m)   Wt 173 lb 9.6 oz (78.7 kg)   SpO2 98%   BMI 30.75 kg/m  , BMI Body mass index is 30.75 kg/m.     GEN: Well nourished, well developed, in no acute distress. HEENT: normal. Neck: Supple, no JVD, carotid bruits, or masses. Cardiac: RRR, no murmurs, rubs, or gallops. No clubbing, cyanosis, edema.  PT 2+ and equal bilaterally.  Respiratory:  Respirations regular and unlabored, clear to auscultation bilaterally. GI: Soft, nontender, nondistended, BS + x 4. MS: no deformity or atrophy. Skin: warm and dry, no rash. Neuro:  Strength and sensation are intact. Psych: Normal affect.  Accessory Clinical Findings    Lab Results  Component Value Date   WBC 18.4 (H) 07/03/2020   HGB 13.3 07/03/2020   HCT 41.2 07/03/2020   MCV 85.3 07/03/2020   PLT 377 07/03/2020  Lab Results  Component Value Date   CREATININE 0.62 07/03/2020   BUN 15 07/03/2020   NA 139 07/03/2020   K 3.8 07/03/2020   CL 109 07/03/2020   CO2 22 07/03/2020   Assessment & Plan    1.  Palpitations: Patient recently evaluated secondary to intermittent palpitations.  She has since undergone event monitoring and an echocardiogram.  ZIO monitor showed predominantly sinus rhythm at an average rate of 89 bpm with rare PACs and PVCs.  Triggered events were associate with sinus rhythm and  sinus tachycardia.  Echocardiogram showed normal LV function with mild mitral regurgitation.  Since her last visit, she has continued to note intermittent elevations in heart rate.  These occur unpredictably and resolve spontaneously.  She does not necessarily think that anxiety plays a significant role.  Monitoring and echo results reviewed in detail.  Reassurance provided.  2.  Disposition: Follow-up as needed.  Nicolasa Ducking, NP 10/03/2021, 8:28 AM

## 2022-11-25 IMAGING — CT CT ANGIO CHEST
2 of 6 series · 18 of 46 positions shown · IV contrast (APPLIED)
Comparison: None

CLINICAL DATA: Central chest pain radiating to back since this
morning, aching and pressure type pain, shortness of breath
accompanying chest pain, also having diarrhea and vomiting

EXAM:
CT ANGIOGRAPHY CHEST WITH CONTRAST
TECHNIQUE: Multidetector CT imaging of the chest was performed using the
standard protocol during bolus administration of intravenous
contrast. Multiplanar CT image reconstructions and MIPs were
obtained to evaluate the vascular anatomy.
CONTRAST:  75mL OMNIPAQUE IOHEXOL 350 MG/ML SOLN IV

[Series 5: thins · axial · 0.62mm/px · z∈[-272,-50]mm · 15 of 245 slices shown]
[im 11/245  lung]
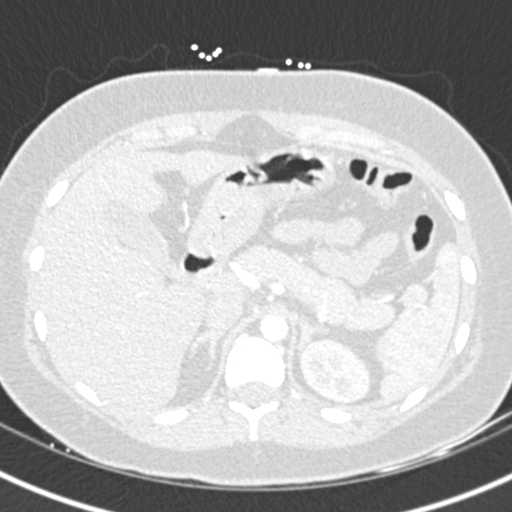
[im 32/245  soft-tissue]
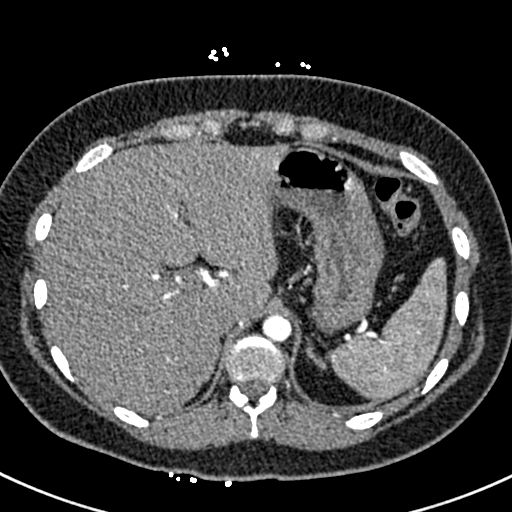
[im 43/245  lung]
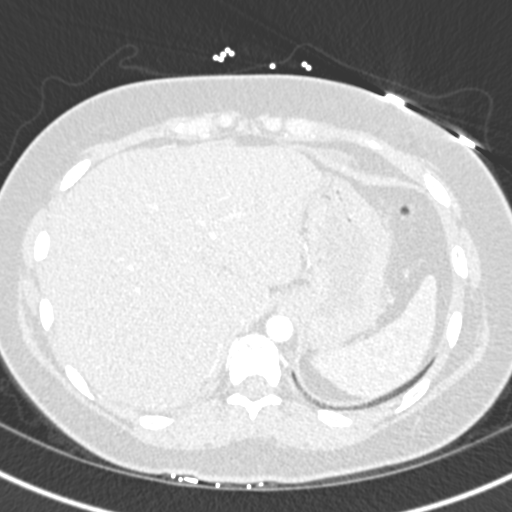
[im 64/245  soft-tissue]
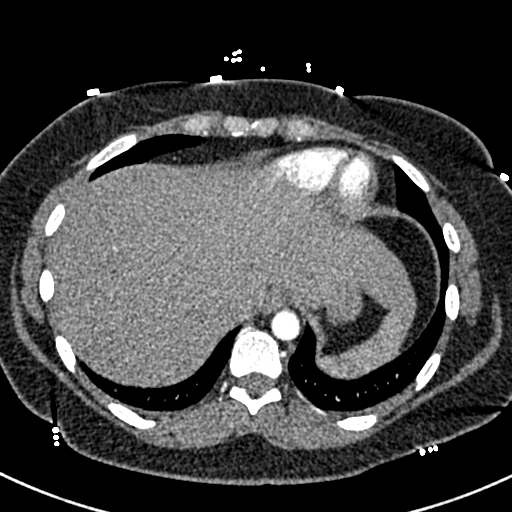
[im 75/245  lung]
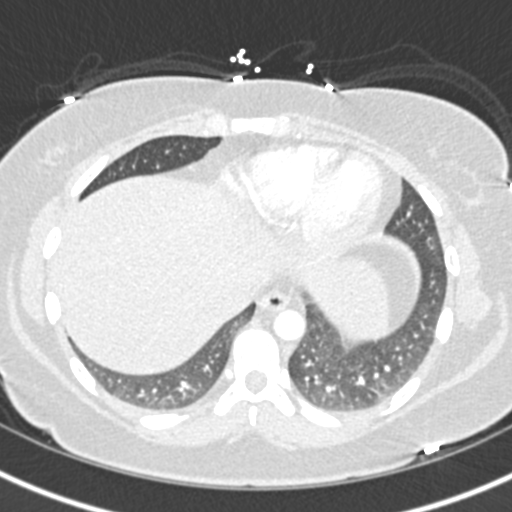
[im 96/245  soft-tissue]
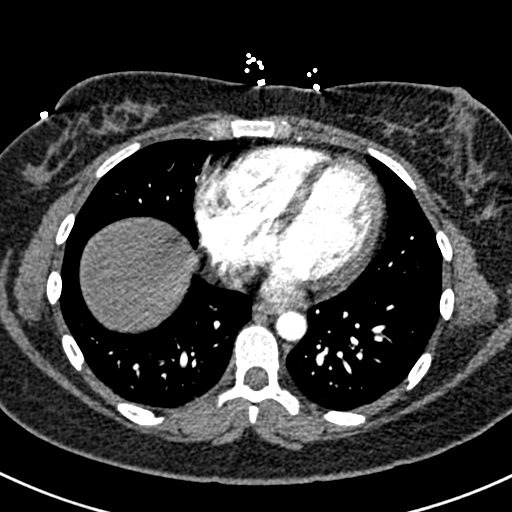
[im 107/245  lung]
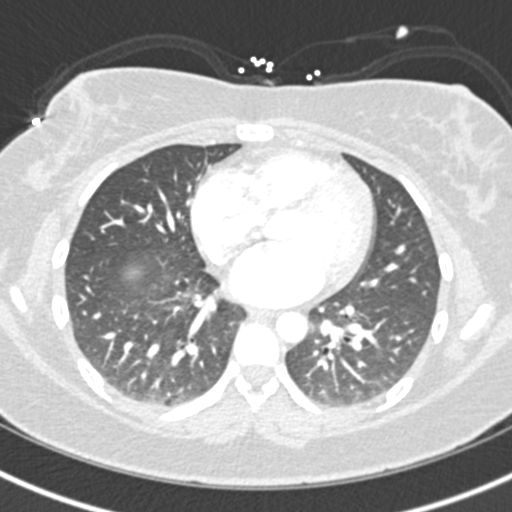
[im 128/245  soft-tissue]
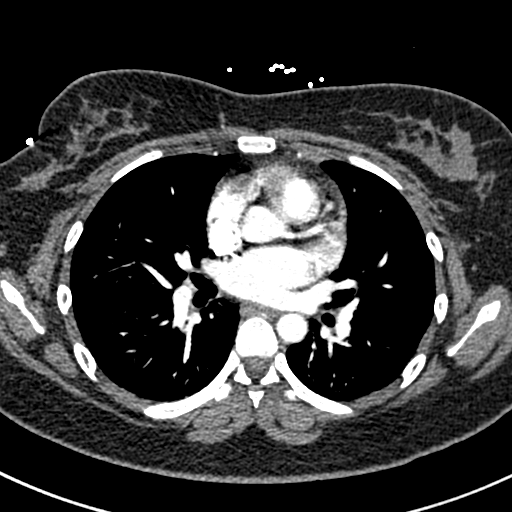
[im 138/245  lung]
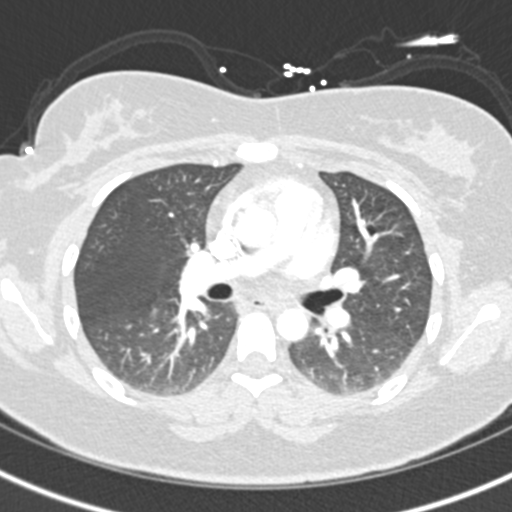
[im 149/245  soft-tissue]
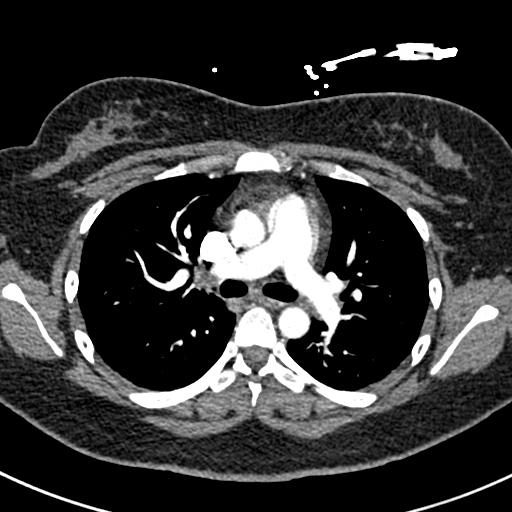
[im 170/245  lung]
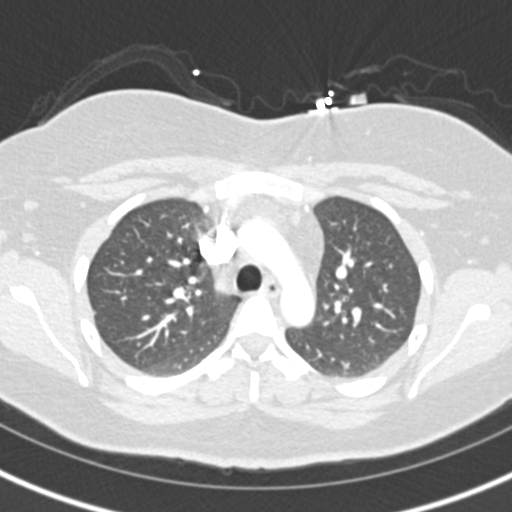
[im 181/245  soft-tissue]
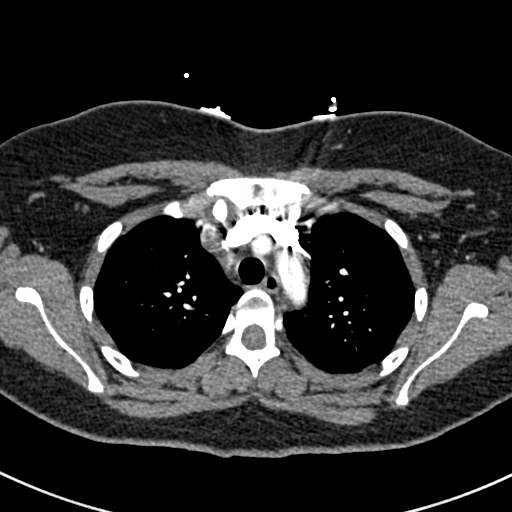
[im 202/245  lung]
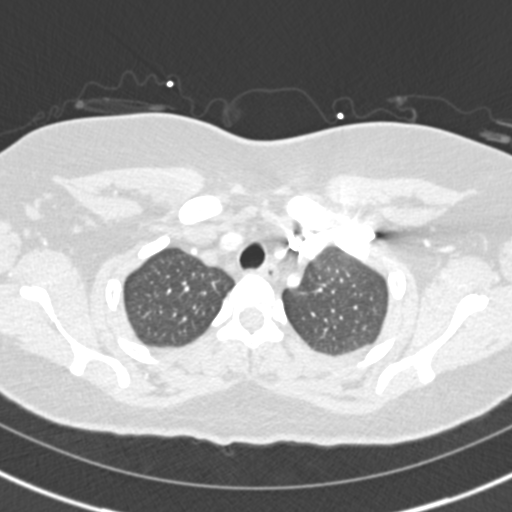
[im 213/245  soft-tissue]
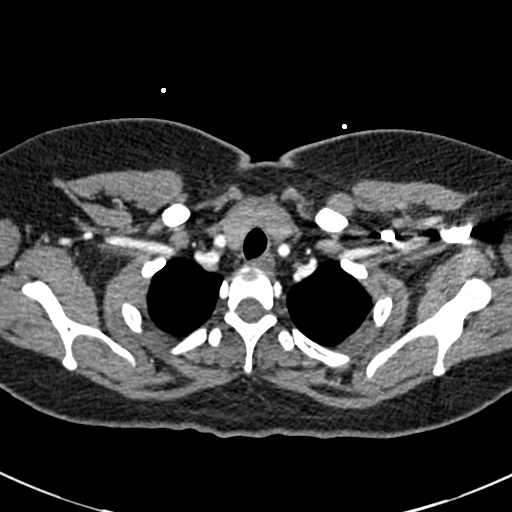
[im 234/245  lung]
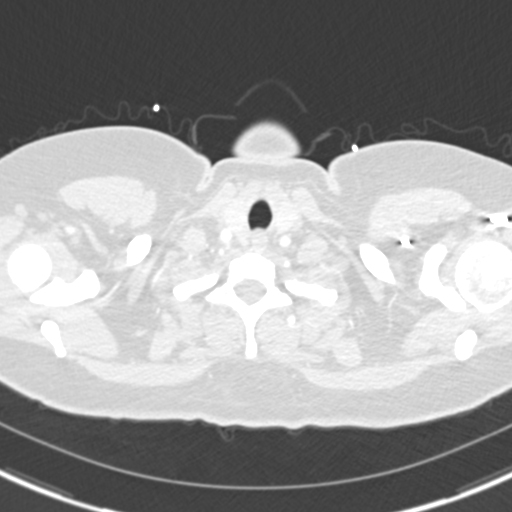

[Series 7: coronal mpr · coronal · 0.49mm/px · 3 of 79 slices shown]
[im 20/79  soft-tissue]
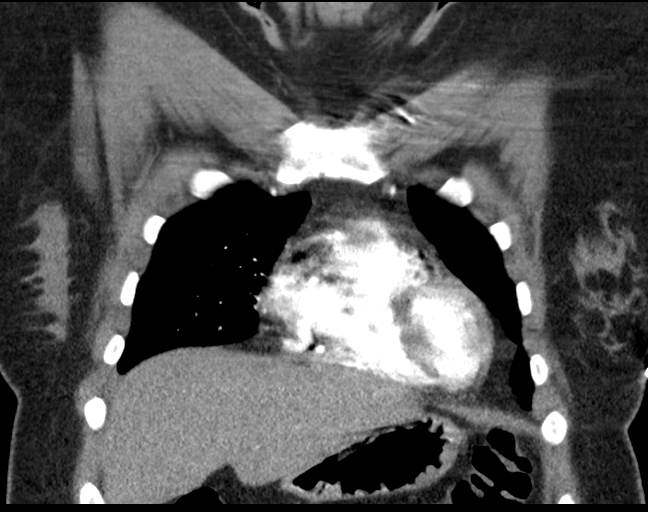
[im 40/79  soft-tissue]
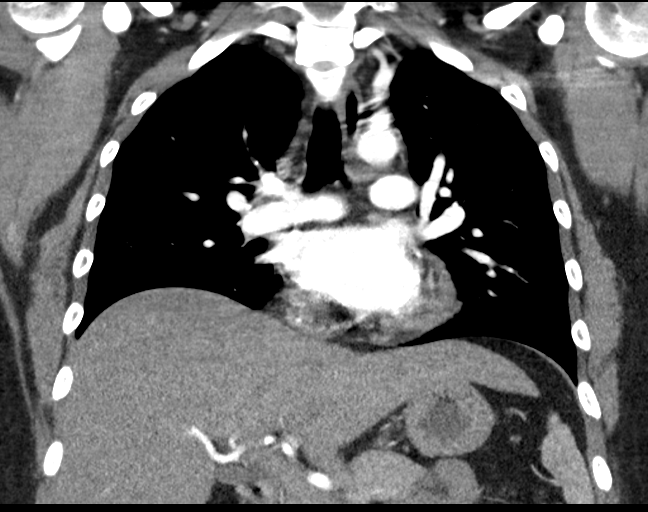
[im 59/79  soft-tissue]
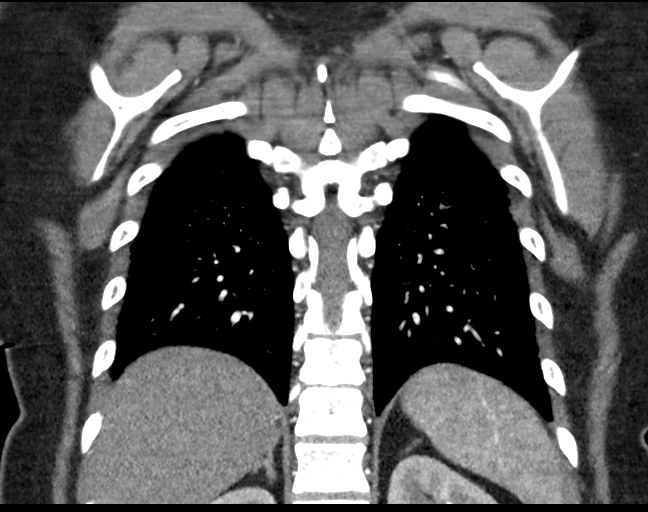

[18 of 46 positions shown; findings below may reference images not displayed]

FINDINGS: Cardiovascular: Aorta normal caliber without aneurysm or dissection.
Heart unremarkable. No pericardial effusion. Pulmonary arteries
adequately opacified and patent. No evidence of pulmonary embolism.

Mediastinum/Nodes: Base of cervical region normal appearance. No
thoracic adenopathy. Esophagus unremarkable.

Lungs/Pleura: Lungs clear. No pulmonary infiltrate, pleural
effusion, or pneumothorax.

Upper Abdomen: Visualized upper abdomen unremarkable

Musculoskeletal: Osseous structures unremarkable.

Review of the MIP images confirms the above findings.
IMPRESSION: Normal CTA chest.
